# Patient Record
Sex: Female | Born: 1995 | Race: White | Hispanic: No | Marital: Single | Smoking: Never smoker
Health system: Southern US, Community
[De-identification: ages and names within clinical notes are randomized; demographics above are authoritative.]

## PROBLEM LIST (undated history)

## (undated) DIAGNOSIS — R51 Headache: Secondary | ICD-10-CM

## (undated) DIAGNOSIS — F329 Major depressive disorder, single episode, unspecified: Secondary | ICD-10-CM

## (undated) DIAGNOSIS — I10 Essential (primary) hypertension: Secondary | ICD-10-CM

## (undated) DIAGNOSIS — F909 Attention-deficit hyperactivity disorder, unspecified type: Secondary | ICD-10-CM

## (undated) DIAGNOSIS — F32A Depression, unspecified: Secondary | ICD-10-CM

## (undated) HISTORY — DX: Attention-deficit hyperactivity disorder, unspecified type: F90.9

## (undated) HISTORY — PX: TONSILECTOMY/ADENOIDECTOMY WITH MYRINGOTOMY: SHX6125

## (undated) HISTORY — DX: Headache: R51

## (undated) HISTORY — PX: OTHER SURGICAL HISTORY: SHX169

---
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
## 2014-01-09 ENCOUNTER — Ambulatory Visit (INDEPENDENT_AMBULATORY_CARE_PROVIDER_SITE_OTHER): Payer: BC Managed Care – PPO | Admitting: Psychiatry

## 2014-01-09 ENCOUNTER — Encounter (HOSPITAL_COMMUNITY): Payer: Self-pay | Admitting: Psychiatry

## 2014-01-09 VITALS — BP 170/84 | Ht 62.0 in | Wt 171.0 lb

## 2014-01-09 DIAGNOSIS — F323 Major depressive disorder, single episode, severe with psychotic features: Secondary | ICD-10-CM

## 2014-01-09 DIAGNOSIS — F332 Major depressive disorder, recurrent severe without psychotic features: Secondary | ICD-10-CM

## 2014-01-09 MED ORDER — HYDROXYZINE HCL 50 MG PO TABS: 50.0000 mg | ORAL_TABLET | Freq: Every day | ORAL | Status: DC

## 2014-01-09 MED ORDER — TOPIRAMATE 100 MG PO TABS: 150.0000 mg | ORAL_TABLET | Freq: Every day | ORAL | Status: DC

## 2014-01-09 MED ORDER — ESCITALOPRAM OXALATE 10 MG PO TABS: 10.0000 mg | ORAL_TABLET | Freq: Every day | ORAL | Status: DC

## 2014-01-09 MED ORDER — TRAZODONE HCL 50 MG PO TABS: ORAL_TABLET | ORAL | Status: DC
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
## 2014-02-20 ENCOUNTER — Encounter (HOSPITAL_COMMUNITY): Payer: Self-pay | Admitting: Psychiatry

## 2014-02-20 ENCOUNTER — Ambulatory Visit (INDEPENDENT_AMBULATORY_CARE_PROVIDER_SITE_OTHER): Payer: BC Managed Care – PPO | Admitting: Psychiatry

## 2014-02-20 VITALS — BP 130/70 | Ht 62.0 in | Wt 173.0 lb

## 2014-02-20 DIAGNOSIS — F332 Major depressive disorder, recurrent severe without psychotic features: Secondary | ICD-10-CM

## 2014-02-20 DIAGNOSIS — F323 Major depressive disorder, single episode, severe with psychotic features: Secondary | ICD-10-CM

## 2014-02-20 MED ORDER — HYDROXYZINE HCL 50 MG PO TABS: 50.0000 mg | ORAL_TABLET | Freq: Every day | ORAL | Status: DC

## 2014-02-20 MED ORDER — ESCITALOPRAM OXALATE 10 MG PO TABS: 10.0000 mg | ORAL_TABLET | Freq: Every day | ORAL | Status: DC

## 2014-02-20 MED ORDER — TOPIRAMATE 100 MG PO TABS: 150.0000 mg | ORAL_TABLET | Freq: Every day | ORAL | Status: DC
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
## 2014-02-23 ENCOUNTER — Other Ambulatory Visit: Payer: Self-pay | Admitting: Nurse Practitioner

## 2014-03-20 ENCOUNTER — Telehealth (HOSPITAL_COMMUNITY): Payer: Self-pay | Admitting: *Deleted

## 2014-04-04 ENCOUNTER — Telehealth (HOSPITAL_COMMUNITY): Payer: Self-pay | Admitting: *Deleted

## 2014-04-05 NOTE — Telephone Encounter (Signed)
Need release

## 2014-04-10 ENCOUNTER — Ambulatory Visit (INDEPENDENT_AMBULATORY_CARE_PROVIDER_SITE_OTHER): Payer: BC Managed Care – PPO | Admitting: Psychiatry

## 2014-04-10 ENCOUNTER — Encounter (HOSPITAL_COMMUNITY): Payer: Self-pay | Admitting: Psychiatry

## 2014-04-10 VITALS — BP 140/70 | Ht 62.0 in | Wt 180.0 lb

## 2014-04-10 DIAGNOSIS — F4310 Post-traumatic stress disorder, unspecified: Secondary | ICD-10-CM

## 2014-04-10 DIAGNOSIS — F332 Major depressive disorder, recurrent severe without psychotic features: Secondary | ICD-10-CM

## 2014-04-10 DIAGNOSIS — F323 Major depressive disorder, single episode, severe with psychotic features: Secondary | ICD-10-CM

## 2014-04-10 MED ORDER — HYDROXYZINE HCL 50 MG PO TABS: 50.0000 mg | ORAL_TABLET | Freq: Every day | ORAL | Status: DC

## 2014-04-10 MED ORDER — TOPIRAMATE 100 MG PO TABS: 150.0000 mg | ORAL_TABLET | Freq: Every day | ORAL | Status: DC

## 2014-04-10 MED ORDER — ESCITALOPRAM OXALATE 20 MG PO TABS: 20.0000 mg | ORAL_TABLET | Freq: Every day | ORAL | Status: DC
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
## 2014-05-05 ENCOUNTER — Encounter (HOSPITAL_COMMUNITY): Payer: Self-pay | Admitting: Psychiatry

## 2014-05-05 ENCOUNTER — Ambulatory Visit (INDEPENDENT_AMBULATORY_CARE_PROVIDER_SITE_OTHER): Payer: BC Managed Care – PPO | Admitting: Psychiatry

## 2014-05-05 VITALS — BP 138/80 | Ht 62.0 in | Wt 180.0 lb

## 2014-05-05 DIAGNOSIS — F323 Major depressive disorder, single episode, severe with psychotic features: Secondary | ICD-10-CM

## 2014-05-05 DIAGNOSIS — F332 Major depressive disorder, recurrent severe without psychotic features: Secondary | ICD-10-CM

## 2014-05-05 MED ORDER — ESCITALOPRAM OXALATE 20 MG PO TABS: 20.0000 mg | ORAL_TABLET | Freq: Every day | ORAL | Status: DC

## 2014-05-05 MED ORDER — TOPIRAMATE 100 MG PO TABS: 150.0000 mg | ORAL_TABLET | Freq: Every day | ORAL | Status: DC

## 2014-05-05 MED ORDER — HYDROXYZINE HCL 50 MG PO TABS: 50.0000 mg | ORAL_TABLET | Freq: Every day | ORAL | Status: DC
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
## 2014-05-11 ENCOUNTER — Ambulatory Visit (INDEPENDENT_AMBULATORY_CARE_PROVIDER_SITE_OTHER): Payer: BC Managed Care – PPO | Admitting: Psychiatry

## 2014-05-11 DIAGNOSIS — F329 Major depressive disorder, single episode, unspecified: Secondary | ICD-10-CM
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
## 2014-05-15 NOTE — Patient Instructions (Signed)
Discussed orally 

## 2014-05-23 ENCOUNTER — Ambulatory Visit (HOSPITAL_COMMUNITY): Payer: Self-pay | Admitting: Psychiatry

## 2014-05-23 ENCOUNTER — Telehealth (HOSPITAL_COMMUNITY): Payer: Self-pay | Admitting: *Deleted

## 2014-05-29 ENCOUNTER — Ambulatory Visit (HOSPITAL_COMMUNITY): Payer: Self-pay | Admitting: Psychiatry

## 2014-06-26 ENCOUNTER — Encounter: Payer: Self-pay | Admitting: Nurse Practitioner

## 2014-06-26 ENCOUNTER — Other Ambulatory Visit: Payer: Self-pay | Admitting: Nurse Practitioner

## 2014-06-26 ENCOUNTER — Ambulatory Visit (INDEPENDENT_AMBULATORY_CARE_PROVIDER_SITE_OTHER): Payer: BC Managed Care – PPO | Admitting: Nurse Practitioner

## 2014-06-26 VITALS — BP 138/70 | Ht 62.0 in | Wt 180.1 lb

## 2014-06-26 DIAGNOSIS — Z79899 Other long term (current) drug therapy: Secondary | ICD-10-CM

## 2014-06-26 DIAGNOSIS — I10 Essential (primary) hypertension: Secondary | ICD-10-CM

## 2014-06-26 MED ORDER — HYDROCHLOROTHIAZIDE 25 MG PO TABS: ORAL_TABLET | ORAL | Status: DC

## 2014-06-26 MED ORDER — VERAPAMIL HCL ER 120 MG PO TBCR
120.0000 mg | EXTENDED_RELEASE_TABLET | Freq: Every day | ORAL | Status: DC
Start: 2014-06-26 — End: 2015-02-24

## 2014-06-26 NOTE — Progress Notes (Signed)
Subjective:  Presents for routine follow up of HTN. No CP/ischemic type pain or SOB. Active. Weight stable. Did not get labs ordered previously. No edema.   Objective:   BP 138/70  Ht 5\' 2"  (1.575 m)  Wt 180 lb 2 oz (81.704 kg)  BMI 32.94 kg/m2 NAD. Alert, oriented. Lungs clear. Heart RRR. Lower extremities no edema.   Assessment:  Problem List Items Addressed This Visit     Cardiovascular and Mediastinum   Essential hypertension, benign - Primary   Relevant Medications      verapamil (CALAN-SR) CR tablet      hydrochlorothiazide tablet   Other Relevant Orders      Lipid panel      Hepatic function panel      Basic metabolic panel    Other Visit Diagnoses   Encounter for long-term (current) use of other medications        Relevant Orders       Hepatic function panel       Basic metabolic panel      Plan:  Meds ordered this encounter  Medications  . verapamil (CALAN-SR) 120 MG CR tablet    Sig: Take 1 tablet (120 mg total) by mouth daily. For hypertension.    Dispense:  30 tablet    Refill:  5    Order Specific Question:  Supervising Provider    Answer:  Darrol JumpJONNALAGADDA, JANARDHAHA R [[id number]]  . DISCONTD: hydrochlorothiazide (HYDRODIURIL) 25 MG tablet    Sig: Take one tablet each morning for your hypertension.    Dispense:  30 tablet    Refill:  5    Order Specific Question:  Supervising Provider    Answer:  Darrol JumpJONNALAGADDA, JANARDHAHA R [[id number]]  . hydrochlorothiazide (HYDRODIURIL) 25 MG tablet    Sig: Take one tablet each morning for your hypertension.    Dispense:  30 tablet    Refill:  5    Order Specific Question:  Supervising Provider    Answer:  Darrol JumpJONNALAGADDA, JANARDHAHA R [[id number]]   Strongly recommend PE. Encouraged exercise, healthy diet and weight loss. She receives Topamax from psych; reminded patient of risk of birth defects associated with med. Also, if dose goes above 200, recommend looking at other forms of birth control. Return in about 6 months (around  12/27/2014).

## 2014-07-05 ENCOUNTER — Ambulatory Visit (INDEPENDENT_AMBULATORY_CARE_PROVIDER_SITE_OTHER): Payer: BC Managed Care – PPO | Admitting: Psychiatry

## 2014-07-05 ENCOUNTER — Encounter (HOSPITAL_COMMUNITY): Payer: Self-pay | Admitting: Psychiatry

## 2014-07-05 VITALS — BP 120/78 | Ht 62.0 in | Wt 180.0 lb

## 2014-07-05 DIAGNOSIS — F332 Major depressive disorder, recurrent severe without psychotic features: Secondary | ICD-10-CM

## 2014-07-05 DIAGNOSIS — F321 Major depressive disorder, single episode, moderate: Secondary | ICD-10-CM

## 2014-07-05 MED ORDER — ESCITALOPRAM OXALATE 20 MG PO TABS
20.0000 mg | ORAL_TABLET | Freq: Every day | ORAL | Status: DC
Start: 2014-07-05 — End: 2014-10-05

## 2014-07-05 MED ORDER — HYDROXYZINE HCL 50 MG PO TABS: 50.0000 mg | ORAL_TABLET | Freq: Every day | ORAL | Status: DC

## 2014-07-05 MED ORDER — TOPIRAMATE 100 MG PO TABS: 150.0000 mg | ORAL_TABLET | Freq: Every day | ORAL | Status: DC
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
## 2014-07-10 ENCOUNTER — Other Ambulatory Visit: Payer: Self-pay | Admitting: Nurse Practitioner

## 2014-07-13 ENCOUNTER — Ambulatory Visit (INDEPENDENT_AMBULATORY_CARE_PROVIDER_SITE_OTHER): Payer: BC Managed Care – PPO | Admitting: Psychiatry

## 2014-07-13 DIAGNOSIS — F321 Major depressive disorder, single episode, moderate: Secondary | ICD-10-CM

## 2014-07-13 NOTE — Patient Instructions (Signed)
Discussed orally 

## 2014-07-13 NOTE — Progress Notes (Signed)
   THERAPIST PROGRESS NOTE  Session Time: Thursday 07/13/2014 2:10 PM - 3:00 PM  Participation Level: Active  Behavioral Response: CasualAlertAnxious  Type of Therapy: Individual Therapy  Treatment Goals addressed:  Establish rapport, improve ability to manage stress and anxiety  Interventions: Supportive  Summary: Amy Burke is a 18 y.o. female who is referred for services by psychiatrist Dr. Tenny Crawoss to improve coping skills. She is a returning patient to this clinician as she was seen once in 2013 due to suffering from anxiety and depression but did not return to treatment due to financial reasons. Patient continued to struggle with symptoms and was hospitalized [facility] due to self-injurious behaviors and suicidal thoughts. Patient reports she took 6 of her mother's pain pills prior to being taken to the ER. She began seeing a psychiatrist Dr. Tenny Crawoss upon discharge for medication management. Patient had been doing fairly well until a month ago when she broke up with her girlfriend. Patient reports this is the third time she and her girlfriend have ended their relationship. Her girlfriend refuses to have any contact with patient who feels abandoned and betrayed as girlfriend was cheating. She also reports stress related to her father as he stated he did not want to get to know her after learning she is pansexual. She reports the relationship is better than it was since father gave up drinking but expresses sadness they do not have a close relationship. Patient also reports stress related to no being where she thinks she should be in life like having a job and a Information systems managerdriver's license.  Patient reports feeling better since last session. She has increased involvement in activity going out with friends, exercising, and looking for a job. She plans to enroll in community college in January and pursue cosmetology degree.  In the interm, she plans to obtain a driver's  license. She reports positive interaction and relationship with both of her parents. Patient recently began dating someone but expresses frustration as their relationship is not clearly defined. Patient states beginning to have strong feelings about the down she is dating but says he does not really respond when she expresses her feelings. She also reports he seems to send her mixed messages. Patient reports part of her trust issues originate from her previous relationships.   Suicidal/Homicidal: No  Therapist Response: Therapist works with patient to process feelings, reinforcing patient's involvement in activity, discuss assertiveness skills and ways to set and maintain boundaries, identify patient's expectations in a relationship, identify ways patient can pursue personal development  Plan: Return again in 3 weeks.  Diagnosis: Axis I: MDD    Axis II: Deferred    Amy Horiuchi, LCSW 07/13/2014

## 2014-08-01 ENCOUNTER — Ambulatory Visit (INDEPENDENT_AMBULATORY_CARE_PROVIDER_SITE_OTHER): Payer: BC Managed Care – PPO | Admitting: Psychiatry

## 2014-08-01 DIAGNOSIS — F321 Major depressive disorder, single episode, moderate: Secondary | ICD-10-CM

## 2014-08-01 NOTE — Patient Instructions (Signed)
Discussed orally 

## 2014-08-01 NOTE — Progress Notes (Signed)
   THERAPIST PROGRESS NOTE  Session Time: Tuesday 08/01/2014 2:05 -2:55 PM  Participation Level: Active  Behavioral Response: CasualAlertAnxious and Depressed/tearful  Type of Therapy: Individual Therapy  Treatment Goals addressed:  Improve ability to manage stress and anxiety      Process and resolve negative feelings associated with the past and her relationship with her parents      Increase self acceptance      Improve emotional regulation skills      Improve interpersonal skills  Interventions: CBT and Supportive  Summary: Amy Burke is a 18 y.o. female who who is referred for services by psychiatrist Dr. Tenny Crawoss to improve coping skills. She is a returning patient to this clinician as she was seen once in 2013 due to suffering from anxiety and depression but did not return to treatment due to financial reasons. Patient continued to struggle with symptoms and was hospitalized [facility] due to self-injurious behaviors and suicidal thoughts.   Since last session, patient reports increased depressed mood and isolative behaviors due to break-up with boyfriend. She blames self for break-up as she reports pushing boyfriend to date each other exclusively. This has triggered abandonment and trust issues patient has experienced since childhood. Patient shares more information today about these issues.  Suicidal/Homicidal: No  Therapist Response: Therapist works with patient to process feelings, develop treatment plan.  Plan: Return again in 3 weeks. Patient agrees to complete self-monitoring feelings form.  Diagnosis: Axis I: MDD, Single Episode, Moderate    Axis II: Deferred    Palmer Shorey, LCSW 08/01/2014

## 2014-08-09 ENCOUNTER — Ambulatory Visit (INDEPENDENT_AMBULATORY_CARE_PROVIDER_SITE_OTHER): Payer: BC Managed Care – PPO | Admitting: Nurse Practitioner

## 2014-08-09 ENCOUNTER — Encounter: Payer: Self-pay | Admitting: Nurse Practitioner

## 2014-08-09 VITALS — BP 128/76 | Ht 62.0 in | Wt 180.0 lb

## 2014-08-09 DIAGNOSIS — N760 Acute vaginitis: Secondary | ICD-10-CM

## 2014-08-09 DIAGNOSIS — Z113 Encounter for screening for infections with a predominantly sexual mode of transmission: Secondary | ICD-10-CM

## 2014-08-10 LAB — GC/CHLAMYDIA PROBE AMP, URINE
CHLAMYDIA, SWAB/URINE, PCR: NEGATIVE
GC PROBE AMP, URINE: NEGATIVE

## 2014-08-15 ENCOUNTER — Encounter: Payer: Self-pay | Admitting: Nurse Practitioner

## 2014-08-15 NOTE — Progress Notes (Signed)
Subjective:  Presents complaints of vaginal discharge for the past 2 weeks. Noticed after intercourse. White discharge with slight yellowish color at times. No fever. No pelvic pain. No missed birth control pills. Has only had one partner. No urinary symptoms. No signs of retained tampon. Is on her menstrual cycle today.  Objective:   BP 128/76  Ht  (1.575 m)  Wt 180 lb (81.647 kg)  BMI 32.91 kg/m2  LMP 08/09/2014 NAD. Alert, oriented. Lungs clear. Heart regular rate rhythm. Abdomen soft nondistended nontender. Unable to get wet prep due to menses.  Assessment: Vaginitis  Screen for STD (sexually transmitted disease) - Plan: GC/chlamydia probe amp, urine  Plan: Routine screening for gonorrhea and Chlamydia. Patient to come back after her cycle if discharge persists so a sample can be obtained. Warning signs reviewed. Reviewed safe sex issues.

## 2014-08-23 ENCOUNTER — Ambulatory Visit (INDEPENDENT_AMBULATORY_CARE_PROVIDER_SITE_OTHER): Payer: BC Managed Care – PPO | Admitting: Psychiatry

## 2014-08-23 DIAGNOSIS — F321 Major depressive disorder, single episode, moderate: Secondary | ICD-10-CM

## 2014-08-23 NOTE — Progress Notes (Signed)
   THERAPIST PROGRESS NOTE  Session Time: Wednesday 08/23/2014 11:10 AM -12:10 PM  Participation Level: Active  Behavioral Response: CasualAlertDepressed  Type of Therapy: Individual Therapy  Treatment Goals addressed: Improve ability to manage stress and anxiety   Interventions: Supportive psychoeducation  Summary: Amy Burke is a 18 y.o. female who is referred for services by psychiatrist Dr. Tenny Craw to improve coping skills. She is a returning patient to this clinician as she was seen once in 2013 due to suffering from anxiety and depression but did not return to treatment due to financial reasons. Patient continued to struggle with symptoms and was hospitalized at the [facility] in November 2014 due to self-injurious behaviors and suicidal thoughts.   Patient reports decreased crying spells but poor motivation, low energy, depressed mood, emotional eating, and excessive sleeping since last session. She states feeling as though the medication may need to be changed She continues to express hurt about breakup with boyfriend. She also expresses frustration, resentment,  and anger with 16 year old sister as patient's parents are providing financial support to her and her 68-year-old daughter and have told patient this is the reason they can't help her with getting her driver's license and insurance. Patient states being put on the "back burner" in the past.    Suicidal/Homicidal: No  Therapist Response: Therapist works with patient to identify and verbalize feelings, identify areas within patient's control, identify ways to improve communication with parents, identify ways to improve self-care and increase activity, provides psychoeducation regarding depression, discuss referral to psychiatrist Dr. Tenny Craw for an earlier appointment for medication management  Plan: Return again in 2 weeks. Patient agrees to schedule an earlier appointment with psychiatrist Dr. Tenny Craw for medication  management. The patient agrees to use plan your day handouts and bring to next session  Diagnosis: Axis I: MDD, single episode, moderate    Axis II: Deferred    Amy Avedisian, LCSW 08/23/2014

## 2014-08-23 NOTE — Patient Instructions (Signed)
Discussed orally 

## 2014-09-07 ENCOUNTER — Telehealth (HOSPITAL_COMMUNITY): Payer: Self-pay | Admitting: *Deleted

## 2014-09-12 ENCOUNTER — Ambulatory Visit (HOSPITAL_COMMUNITY): Payer: Self-pay | Admitting: Psychiatry

## 2014-09-25 ENCOUNTER — Ambulatory Visit (HOSPITAL_COMMUNITY): Payer: Self-pay | Admitting: Psychiatry

## 2014-10-03 ENCOUNTER — Ambulatory Visit (HOSPITAL_COMMUNITY): Payer: Self-pay | Admitting: Psychiatry

## 2014-10-05 ENCOUNTER — Ambulatory Visit (INDEPENDENT_AMBULATORY_CARE_PROVIDER_SITE_OTHER): Payer: BC Managed Care – PPO | Admitting: Psychiatry

## 2014-10-05 ENCOUNTER — Encounter (HOSPITAL_COMMUNITY): Payer: Self-pay | Admitting: Psychiatry

## 2014-10-05 VITALS — BP 136/96 | HR 92 | Ht 62.0 in | Wt 187.0 lb

## 2014-10-05 DIAGNOSIS — F321 Major depressive disorder, single episode, moderate: Secondary | ICD-10-CM

## 2014-10-05 DIAGNOSIS — F332 Major depressive disorder, recurrent severe without psychotic features: Secondary | ICD-10-CM

## 2014-10-05 MED ORDER — TOPIRAMATE 100 MG PO TABS: 150.0000 mg | ORAL_TABLET | Freq: Every day | ORAL | Status: DC

## 2014-10-05 MED ORDER — HYDROXYZINE HCL 50 MG PO TABS: 50.0000 mg | ORAL_TABLET | Freq: Every day | ORAL | Status: AC

## 2014-10-05 MED ORDER — ESCITALOPRAM OXALATE 20 MG PO TABS: 20.0000 mg | ORAL_TABLET | Freq: Every day | ORAL | Status: DC
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
## 2014-11-03 ENCOUNTER — Other Ambulatory Visit: Payer: Self-pay | Admitting: Nurse Practitioner

## 2014-11-29 ENCOUNTER — Emergency Department (HOSPITAL_COMMUNITY)
Admission: EM | Admit: 2014-11-29 | Discharge: 2014-11-29 | Disposition: A | Discharge status: Home / Self Care | Payer: BC Managed Care – PPO | Attending: Emergency Medicine | Admitting: Emergency Medicine

## 2014-11-29 ENCOUNTER — Emergency Department (HOSPITAL_COMMUNITY): Payer: BC Managed Care – PPO

## 2014-11-29 ENCOUNTER — Encounter (HOSPITAL_COMMUNITY): Payer: Self-pay | Admitting: *Deleted

## 2014-11-29 DIAGNOSIS — S80212A Abrasion, left knee, initial encounter: Secondary | ICD-10-CM | POA: Insufficient documentation

## 2014-11-29 DIAGNOSIS — W010XXA Fall on same level from slipping, tripping and stumbling without subsequent striking against object, initial encounter: Secondary | ICD-10-CM | POA: Diagnosis not present

## 2014-11-29 DIAGNOSIS — F329 Major depressive disorder, single episode, unspecified: Secondary | ICD-10-CM | POA: Diagnosis not present

## 2014-11-29 DIAGNOSIS — T07XXXA Unspecified multiple injuries, initial encounter: Secondary | ICD-10-CM

## 2014-11-29 DIAGNOSIS — S99911A Unspecified injury of right ankle, initial encounter: Secondary | ICD-10-CM | POA: Diagnosis present

## 2014-11-29 DIAGNOSIS — Y998 Other external cause status: Secondary | ICD-10-CM | POA: Insufficient documentation

## 2014-11-29 DIAGNOSIS — Y9289 Other specified places as the place of occurrence of the external cause: Secondary | ICD-10-CM | POA: Insufficient documentation

## 2014-11-29 DIAGNOSIS — S93401A Sprain of unspecified ligament of right ankle, initial encounter: Secondary | ICD-10-CM | POA: Diagnosis not present

## 2014-11-29 DIAGNOSIS — S80211A Abrasion, right knee, initial encounter: Secondary | ICD-10-CM | POA: Insufficient documentation

## 2014-11-29 DIAGNOSIS — Z79899 Other long term (current) drug therapy: Secondary | ICD-10-CM | POA: Diagnosis not present

## 2014-11-29 DIAGNOSIS — I10 Essential (primary) hypertension: Secondary | ICD-10-CM | POA: Insufficient documentation

## 2014-11-29 DIAGNOSIS — G43909 Migraine, unspecified, not intractable, without status migrainosus: Secondary | ICD-10-CM | POA: Diagnosis not present

## 2014-11-29 DIAGNOSIS — Y9389 Activity, other specified: Secondary | ICD-10-CM | POA: Insufficient documentation

## 2014-11-29 DIAGNOSIS — T1490XA Injury, unspecified, initial encounter: Secondary | ICD-10-CM

## 2014-11-29 MED ORDER — BACITRACIN 500 UNIT/GM EX OINT
1.0000 "application " | TOPICAL_OINTMENT | Freq: Two times a day (BID) | CUTANEOUS | Status: DC
  Administered 2014-11-29: 1 via TOPICAL
  Filled 2014-11-29 (×4): qty 0.9

## 2014-11-29 MED ORDER — BACITRACIN ZINC 500 UNIT/GM EX OINT
TOPICAL_OINTMENT | CUTANEOUS | Status: AC
  Administered 2014-11-29: 1 via TOPICAL
  Filled 2014-11-29: qty 0.9

## 2014-11-29 MED ORDER — IBUPROFEN 800 MG PO TABS: 800.0000 mg | ORAL_TABLET | Freq: Three times a day (TID) | ORAL | Status: AC

## 2014-11-29 MED ORDER — IBUPROFEN 800 MG PO TABS
800.0000 mg | ORAL_TABLET | Freq: Once | ORAL | Status: AC
  Administered 2014-11-29: 800 mg via ORAL
  Filled 2014-11-29: qty 1

## 2014-11-29 NOTE — Discharge Instructions (Signed)

## 2014-11-29 NOTE — ED Provider Notes (Signed)
CSN: [id number]     Arrival date & time 11/29/14  1612 History   First MD Initiated Contact with Patient 11/29/14 1705     Chief Complaint  Patient presents with  . Ankle Pain     (Consider location/radiation/quality/duration/timing/severity/associated sxs/prior Treatment) HPI  Amy Burke is a 18 y.o. female who presents to the Emergency Department complaining of right ankle pain.  She states the she tripped and fell, landing on her knees and twisting her ankle.  Reports pain to outside of her ankle that is worse with movement and weight bearing.  She states that her knees are "okay just scraped".  She denies swelling, numbness or weakness, or difficulty bending her knees.  She has not tried any therapies prior to ED arrival.    Past Medical History  Diagnosis Date  . Headache(784.0)     Migraines  . ADHD (attention deficit hyperactivity disorder)   . Depression   . Hypertension    Past Surgical History  Procedure Laterality Date  . Tubes in ears    . Tonsilectomy/adenoidectomy with myringotomy     Family History  Problem Relation Age of Onset  . Anxiety disorder Mother   . Depression Mother   . ADD / ADHD Sister   . Bipolar disorder Sister   . Depression Maternal Uncle   . Alcohol abuse Father    History  Substance Use Topics  . Smoking status: Never Smoker   . Smokeless tobacco: Never Used  . Alcohol Use: No   OB History    No data available     Review of Systems  Constitutional: Negative for fever and chills.  Genitourinary: Negative for dysuria and difficulty urinating.  Musculoskeletal: Positive for joint swelling and arthralgias (right ankle pain).  Skin: Negative for color change and wound.       Abrasions both knees   Neurological: Negative for dizziness and syncope.  All other systems reviewed and are negative.     Allergies  Mustard  Home Medications   Prior to Admission medications   Medication Sig Start Date End Date Taking?  Authorizing Provider  escitalopram (LEXAPRO) 20 MG tablet Take 1 tablet (20 mg total) by mouth daily. 10/05/14 10/05/15  Diannia Rudereborah Ross, MD  hydrochlorothiazide (HYDRODIURIL) 25 MG tablet Take one tablet each morning for your hypertension. 06/26/14   Campbell Richesarolyn C Hoskins, NP  hydrOXYzine (ATARAX/VISTARIL) 50 MG tablet Take 1 tablet (50 mg total) by mouth at bedtime. For insomnia. 10/05/14   Diannia Rudereborah Ross, MD  PORTIA-28 0.15-30 MG-MCG tablet TAKE 1 TABLET ONCE DAILY AS DIRECTED. 11/04/14   Merlyn AlbertWilliam S Luking, MD  topiramate (TOPAMAX) 100 MG tablet Take 1.5 tablets (150 mg total) by mouth daily. Take 1 and 1/2 tablets by mouth daily for mood stabilization. 10/05/14   Diannia Rudereborah Ross, MD  verapamil (CALAN-SR) 120 MG CR tablet Take 1 tablet (120 mg total) by mouth daily. For hypertension. 06/26/14   Campbell Richesarolyn C Hoskins, NP   BP 133/80 mmHg  Pulse 88  Temp(Src) 98.5 F (36.9 C) (Oral)  Resp 18  Ht 5\' 2"  (1.575 m)  Wt 180 lb (81.647 kg)  BMI 32.91 kg/m2  SpO2 99%  LMP 11/15/2014 Physical Exam  Constitutional: She is oriented to person, place, and time. She appears well-developed and well-nourished. No distress.  HENT:  Head: Normocephalic and atraumatic.  Neck: Normal range of motion. Neck supple.  Cardiovascular: Normal rate, regular rhythm, normal heart sounds and intact distal pulses.   No murmur heard. Pulmonary/Chest: Effort normal  and breath sounds normal.  Musculoskeletal: She exhibits tenderness.  ttp of the lateral right ankle, mild STS present.  No obvious ligament laxity.  DP pulse is brisk,distal sensation intact.  No erythema, abrasion, bruising or bony deformity.  No proximal tenderness. Pt has fullR OM of the bilateral knees, no edema  Neurological: She is alert and oriented to person, place, and time. She exhibits normal muscle tone. Coordination normal.  Skin: Skin is warm and dry.  Small abrasions to bilateral knees.   Nursing note and vitals reviewed.   ED Course  Procedures  (including critical care time) Labs Review Labs Reviewed - No data to display  Imaging Review Dg Ankle Complete Right  11/29/2014   CLINICAL DATA:  Lateral ankle pain and swelling post fall 1 hr ago, tripped on walkway and fell on knees  EXAM: RIGHT ANKLE - COMPLETE 3+ VIEW  COMPARISON:  None  FINDINGS: Three views of the right ankle submitted. No acute fracture or subluxation. Ankle mortise is preserved. Significant soft tissue swelling adjacent to lateral malleolus.  IMPRESSION: No acute fracture or subluxation.  Lateral soft tissue swelling.   Electronically Signed   By: Natasha MeadLiviu  Pop M.D.   On: 11/29/2014 16:46     EKG Interpretation None      MDM   Final diagnoses:  Multiple abrasions  Sprain of ankle, right, initial encounter    abrasions to the knees were cleaned and dressed with bacitracin. ASO applied.  Pain improved.  Td is UTD per patient. Pt has own crutches.  Agrees to elevate, ice and ortho f/u in one week if needed.  Rx for 800 mg ibuprofen    Prudencio Velazco L. Trisha Mangleriplett, PA-C 12/01/14 1318  Flint MelterElliott L Wentz, MD 12/02/14 [phone]

## 2014-11-29 NOTE — ED Notes (Signed)
Patient given discharge instruction, verbalized understand. Patient ambulatory out of the department.  

## 2014-11-29 NOTE — ED Notes (Signed)
Pt states pain to right ankle and abrasions to both knees after falling while getting her niece off the bus just PTA.

## 2015-01-05 ENCOUNTER — Ambulatory Visit (HOSPITAL_COMMUNITY): Payer: Self-pay | Admitting: Psychiatry

## 2015-01-22 ENCOUNTER — Encounter: Payer: Self-pay | Admitting: Nurse Practitioner

## 2015-01-22 ENCOUNTER — Ambulatory Visit (INDEPENDENT_AMBULATORY_CARE_PROVIDER_SITE_OTHER): Payer: BC Managed Care – PPO | Admitting: Nurse Practitioner

## 2015-01-22 VITALS — BP 120/70 | Ht 62.0 in | Wt 191.2 lb

## 2015-01-22 DIAGNOSIS — N760 Acute vaginitis: Secondary | ICD-10-CM

## 2015-01-22 DIAGNOSIS — Z0000 Encounter for general adult medical examination without abnormal findings: Secondary | ICD-10-CM

## 2015-01-22 DIAGNOSIS — H65193 Other acute nonsuppurative otitis media, bilateral: Secondary | ICD-10-CM

## 2015-01-22 DIAGNOSIS — Z01419 Encounter for gynecological examination (general) (routine) without abnormal findings: Secondary | ICD-10-CM

## 2015-01-22 LAB — POCT WET PREP WITH KOH
Clue Cells Wet Prep HPF POC: POSITIVE
Epithelial Wet Prep HPF POC: POSITIVE
KOH PREP POC: NEGATIVE
Trichomonas, UA: NEGATIVE
pH, Wet Prep: 4.5

## 2015-01-22 MED ORDER — AZITHROMYCIN 250 MG PO TABS: ORAL_TABLET | ORAL | Status: DC

## 2015-01-22 MED ORDER — FLUCONAZOLE 150 MG PO TABS: ORAL_TABLET | ORAL | Status: DC

## 2015-01-22 NOTE — Progress Notes (Signed)
Subjective:    Patient ID: Amy Burke, female    DOB: 1996/02/28, 19 y.o.   MRN: [id number]  HPI presents for her wellness exam. Her mother is present for part of visit. C/o vaginal discharge since August. White with odor. No pelvic pain. Mo missed oc's. Regular menses, normal flow. No new sexual partners since August. Needs dental exam. Working out on a regular basis. Continues to see psychiatrist.    Review of Systems  Constitutional: Negative for fever, activity change, appetite change and fatigue.  HENT: Positive for congestion and rhinorrhea. Negative for dental problem, ear pain, sinus pressure and sore throat.   Respiratory: Negative for cough, chest tightness, shortness of breath and wheezing.   Cardiovascular: Negative for chest pain.  Gastrointestinal: Negative for nausea, vomiting, abdominal pain, diarrhea, constipation and abdominal distention.  Genitourinary: Negative for dysuria, urgency, frequency, vaginal discharge, enuresis, difficulty urinating, genital sores, menstrual problem and pelvic pain.       Objective:   Physical Exam  Constitutional: She is oriented to person, place, and time. She appears well-developed. No distress.  HENT:  Right Ear: External ear normal.  Left Ear: External ear normal.  TMs dull with moderate erythema bilat; yellowish effusion on left. Pharynx moderate erythema.  Neck: Normal range of motion. Neck supple. No tracheal deviation present. No thyromegaly present.  Cardiovascular: Normal rate, regular rhythm and normal heart sounds.  Exam reveals no gallop.   No murmur heard. Pulmonary/Chest: Effort normal and breath sounds normal.  Abdominal: Soft. She exhibits no distension. There is no tenderness.  Genitourinary: Uterus normal. Vaginal discharge found.  External GU: no rashes or lesions. Vagina: moderate amount yellowish mucoid discharge. No CMT. Bimanual exam: no tenderness or obvious masses; exam limited due to abd girth.     Musculoskeletal: She exhibits no edema.  Lymphadenopathy:    She has cervical adenopathy.  Neurological: She is alert and oriented to person, place, and time.  Skin: Skin is warm and dry. No rash noted.  Psychiatric: She has a normal mood and affect. Her behavior is normal. Thought content normal.  Vitals reviewed. Breast exam: no masses; axillae no adenopathy.  Results for orders placed or performed in visit on 01/22/15  POCT Wet Prep with KOH  Result Value Ref Range   Trichomonas, UA Negative    Clue Cells Wet Prep HPF POC pos    Epithelial Wet Prep HPF POC pos    Yeast Wet Prep HPF POC occas    Bacteria Wet Prep HPF POC rare    RBC Wet Prep HPF POC rare    WBC Wet Prep HPF POC occas    KOH Prep POC Negative    pH, Wet Prep 4.5            Assessment & Plan:  Well woman exam  Vaginitis and vulvovaginitis - Plan: GC/chlamydia probe amp, urine  Acute nonsuppurative otitis media of both ears  Meds ordered this encounter  Medications  . azithromycin (ZITHROMAX Z-PAK) 250 MG tablet    Sig: Take 2 tablets (500 mg) on  Day 1,  followed by 1 tablet (250 mg) once daily on Days 2 through 5.    Dispense:  6 each    Refill:  0    Order Specific Question:  Supervising Provider    Answer:  Merlyn Albert [[id number]]  . fluconazole (DIFLUCAN) 150 MG tablet    Sig: One po qd prn yeast infection; may repeat in 3-4 days if needed  Dispense:  2 tablet    Refill:  0    Order Specific Question:  Supervising Provider    Answer:  Merlyn AlbertLUKING, WILLIAM S [[id number]]   Encouraged continued weight loss and exercise. Discussed safe sex issues. Call back in 7-10 days if discharge persists. Will prescribe Metronidazole.  May need to reduce or stop BP med if continued weight loss. To monitor BP outside of office and call back if any problems. Patient was sure she had a Dt at recent ED visit but was not given according to notes. A review of her paper chart also shows no evidence of vaccine. Will address at  later visit.  Return in about 6 months (around 07/23/2015). Next PE in one year.

## 2015-01-23 LAB — GC/CHLAMYDIA PROBE AMP, URINE
Chlamydia, Swab/Urine, PCR: NEGATIVE
GC Probe Amp, Urine: NEGATIVE

## 2015-01-24 ENCOUNTER — Encounter: Payer: Self-pay | Admitting: *Deleted

## 2015-01-25 ENCOUNTER — Ambulatory Visit (HOSPITAL_COMMUNITY): Payer: Self-pay | Admitting: Psychiatry

## 2015-01-30 ENCOUNTER — Other Ambulatory Visit: Payer: Self-pay | Admitting: *Deleted

## 2015-01-30 ENCOUNTER — Telehealth: Payer: Self-pay | Admitting: Nurse Practitioner

## 2015-01-30 MED ORDER — METRONIDAZOLE 500 MG PO TABS: 500.0000 mg | ORAL_TABLET | Freq: Two times a day (BID) | ORAL | Status: DC

## 2015-01-30 NOTE — Telephone Encounter (Signed)
Pt is done with the diflucan, still having itch, irritation, heavy discharge  [facility]

## 2015-01-30 NOTE — Telephone Encounter (Signed)
Discussed with pt. Med sent to pharm.  

## 2015-01-30 NOTE — Telephone Encounter (Signed)
Flagyl 500, 1 bid 7 days

## 2015-01-30 NOTE — Telephone Encounter (Signed)
Pt was seen here on 02/08 for wellness exam with Eber Jonesarolyn. DX with vaginitis and vilvovaginitis. Was prescribed zpak and diflucan.  Was told to call back in 7-10 days if discharge persists. Will prescribe Metronidazole.   Need to know dose of metronidazole.

## 2015-02-22 NOTE — Progress Notes (Signed)
Card sent 

## 2015-02-24 ENCOUNTER — Other Ambulatory Visit: Payer: Self-pay | Admitting: Nurse Practitioner

## 2015-05-04 ENCOUNTER — Other Ambulatory Visit (HOSPITAL_COMMUNITY): Payer: Self-pay | Admitting: Psychiatry

## 2015-05-04 ENCOUNTER — Other Ambulatory Visit: Payer: Self-pay | Admitting: Nurse Practitioner

## 2015-05-22 ENCOUNTER — Other Ambulatory Visit: Payer: Self-pay | Admitting: Family Medicine

## 2015-05-28 ENCOUNTER — Telehealth: Payer: Self-pay | Admitting: Family Medicine

## 2015-05-28 NOTE — Telephone Encounter (Signed)
Notified Christine (mom) that this medication is prescribed by Dr. Tenny Craw and they will need to contact Dr. Charlott Rakes office for refills on this medication. Mom verbalized understanding.

## 2015-05-28 NOTE — Telephone Encounter (Signed)
Pt is needing a refill on her escitalopram 20 mg. I informed pt's mother that due to a different Dr prescribing it last that more than likely it will have to be refilled through that Dr.  Robbie Lis

## 2015-06-08 ENCOUNTER — Ambulatory Visit (INDEPENDENT_AMBULATORY_CARE_PROVIDER_SITE_OTHER): Payer: BC Managed Care – PPO | Admitting: Nurse Practitioner

## 2015-06-08 VITALS — BP 132/80 | Ht 62.0 in | Wt 203.0 lb

## 2015-06-08 DIAGNOSIS — I10 Essential (primary) hypertension: Secondary | ICD-10-CM

## 2015-06-08 MED ORDER — LEVONORGESTREL-ETHINYL ESTRAD 0.15-30 MG-MCG PO TABS: ORAL_TABLET | ORAL | Status: DC

## 2015-06-08 MED ORDER — HYDROCHLOROTHIAZIDE 25 MG PO TABS: ORAL_TABLET | ORAL | Status: DC

## 2015-06-08 MED ORDER — VERAPAMIL HCL ER 120 MG PO TBCR: EXTENDED_RELEASE_TABLET | ORAL | Status: DC

## 2015-06-08 NOTE — Patient Instructions (Addendum)
Phentermine for weight loss Couch potato to 5k [email or web address]

## 2015-06-11 ENCOUNTER — Other Ambulatory Visit (HOSPITAL_COMMUNITY): Payer: Self-pay | Admitting: Psychiatry

## 2015-06-11 ENCOUNTER — Encounter: Payer: Self-pay | Admitting: Nurse Practitioner

## 2015-06-11 NOTE — Progress Notes (Signed)
Subjective:  Presents for routine follow-up on blood pressure. Working out on a regular basis. According to patient she had 4 pounds weight loss over the past week. Cycles are regular with normal flow. Denies any missed birth control pills. Needs refill today. No intercourse 4 months, no new partners since her last gonorrhea and Chlamydia test in February. No chest pain/shortness of breath or edema.  Objective:   BP 132/80 mmHg  Ht 5\' 2"  (1.575 m)  Wt 203 lb (92.08 kg)  BMI 37.12 kg/m2 NAD. Alert, oriented. Lungs clear. Heart regular rate rhythm. Lower extremities no edema.  Assessment:  Problem List Items Addressed This Visit      Cardiovascular and Mediastinum   Essential hypertension, benign - Primary   Relevant Medications   verapamil (CALAN-SR) 120 MG CR tablet   hydrochlorothiazide (HYDRODIURIL) 25 MG tablet     Plan:  Meds ordered this encounter  Medications  . levonorgestrel-ethinyl estradiol (PORTIA-28) 0.15-30 MG-MCG tablet    Sig: TAKE 1 TABLET ONCE DAILY AS DIRECTED.    Dispense:  28 tablet    Refill:  11    Order Specific Question:  Supervising Provider    Answer:  Merlyn AlbertLUKING, WILLIAM S [[id number]]  . verapamil (CALAN-SR) 120 MG CR tablet    Sig: TAKE 1 TABLET EACH MORNING FOR BLOOD PRESSURE.    Dispense:  30 tablet    Refill:  5    Order Specific Question:  Supervising Provider    Answer:  Merlyn AlbertLUKING, WILLIAM S [[id number]]  . hydrochlorothiazide (HYDRODIURIL) 25 MG tablet    Sig: TAKE ONE TABLET EVERY MORNING FOR HIGH BLOOD PRESSURE.    Dispense:  30 tablet    Refill:  5    Order Specific Question:  Supervising Provider    Answer:  Merlyn AlbertLUKING, WILLIAM S [[id number]]   Continue current blood pressure medication. Encouraged weight loss and continued exercise. Discussed safe sex issues.  Return in about 6 months (around 12/08/2015) for recheck. Continue follow up with mental health.

## 2015-06-27 ENCOUNTER — Ambulatory Visit (INDEPENDENT_AMBULATORY_CARE_PROVIDER_SITE_OTHER): Payer: BC Managed Care – PPO | Admitting: Psychiatry

## 2015-06-27 ENCOUNTER — Encounter (HOSPITAL_COMMUNITY): Payer: Self-pay | Admitting: Psychiatry

## 2015-06-27 VITALS — BP 155/98 | HR 86 | Ht 60.0 in | Wt 203.0 lb

## 2015-06-27 DIAGNOSIS — F332 Major depressive disorder, recurrent severe without psychotic features: Secondary | ICD-10-CM | POA: Diagnosis not present

## 2015-06-27 DIAGNOSIS — F321 Major depressive disorder, single episode, moderate: Secondary | ICD-10-CM

## 2015-06-27 MED ORDER — TOPIRAMATE 100 MG PO TABS: 150.0000 mg | ORAL_TABLET | Freq: Every day | ORAL | Status: AC

## 2015-06-27 MED ORDER — LISDEXAMFETAMINE DIMESYLATE 40 MG PO CAPS: 40.0000 mg | ORAL_CAPSULE | ORAL | Status: AC

## 2015-06-27 MED ORDER — ESCITALOPRAM OXALATE 20 MG PO TABS: 20.0000 mg | ORAL_TABLET | Freq: Every day | ORAL | Status: AC
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
## 2015-06-28 ENCOUNTER — Telehealth: Payer: Self-pay | Admitting: Nurse Practitioner

## 2015-06-28 ENCOUNTER — Other Ambulatory Visit: Payer: Self-pay | Admitting: Nurse Practitioner

## 2015-06-28 NOTE — Telephone Encounter (Signed)
Dr. Tenny Crawoss recommends TSH and dietary consultation for this patient. I see order for TSH in chart but not sure why it is in there or if patient knows about it. Please put in order. I will go ahead and send one for dietician. Thanks.

## 2015-07-11 ENCOUNTER — Ambulatory Visit (HOSPITAL_COMMUNITY): Payer: Self-pay | Admitting: Psychiatry

## 2015-07-20 ENCOUNTER — Other Ambulatory Visit: Payer: Self-pay | Admitting: Nurse Practitioner

## 2015-07-26 ENCOUNTER — Telehealth (HOSPITAL_COMMUNITY): Payer: Self-pay | Admitting: *Deleted

## 2015-07-26 ENCOUNTER — Ambulatory Visit (HOSPITAL_COMMUNITY): Payer: Self-pay | Admitting: Psychiatry

## 2015-11-28 ENCOUNTER — Encounter: Payer: Self-pay | Admitting: Nurse Practitioner

## 2015-11-28 ENCOUNTER — Ambulatory Visit (INDEPENDENT_AMBULATORY_CARE_PROVIDER_SITE_OTHER): Payer: BC Managed Care – PPO | Admitting: Nurse Practitioner

## 2015-11-28 VITALS — BP 146/90 | Ht 62.0 in | Wt 221.0 lb

## 2015-11-28 DIAGNOSIS — H66001 Acute suppurative otitis media without spontaneous rupture of ear drum, right ear: Secondary | ICD-10-CM

## 2015-11-28 DIAGNOSIS — J329 Chronic sinusitis, unspecified: Secondary | ICD-10-CM | POA: Diagnosis not present

## 2015-11-28 DIAGNOSIS — R5383 Other fatigue: Secondary | ICD-10-CM

## 2015-11-28 DIAGNOSIS — Z79899 Other long term (current) drug therapy: Secondary | ICD-10-CM | POA: Diagnosis not present

## 2015-11-28 DIAGNOSIS — J310 Chronic rhinitis: Secondary | ICD-10-CM

## 2015-11-28 DIAGNOSIS — Z13220 Encounter for screening for lipoid disorders: Secondary | ICD-10-CM | POA: Diagnosis not present

## 2015-11-28 DIAGNOSIS — I10 Essential (primary) hypertension: Secondary | ICD-10-CM

## 2015-11-28 DIAGNOSIS — F323 Major depressive disorder, single episode, severe with psychotic features: Secondary | ICD-10-CM | POA: Diagnosis not present

## 2015-11-28 MED ORDER — AMOXICILLIN-POT CLAVULANATE 875-125 MG PO TABS: 1.0000 | ORAL_TABLET | Freq: Two times a day (BID) | ORAL | Status: AC

## 2015-11-28 MED ORDER — VERAPAMIL HCL ER 120 MG PO TBCR: EXTENDED_RELEASE_TABLET | ORAL | Status: DC

## 2015-11-28 MED ORDER — HYDROCHLOROTHIAZIDE 25 MG PO TABS: ORAL_TABLET | ORAL | Status: AC

## 2015-11-28 NOTE — Patient Instructions (Signed)
OTC antihistamine Nasacort AQ as directed 

## 2015-11-30 ENCOUNTER — Encounter: Payer: Self-pay | Admitting: Nurse Practitioner

## 2015-11-30 NOTE — Progress Notes (Signed)
Subjective:  Presents for recheck on HTN. Rarely misses pill. Has gained weight; not doing well with diet. Active. Has decided to stop seeing Dr. Tenny Crawoss for personal reasons. Has a serious history of mental health issues. Ear pain x 1 month. No sore throat. Cough. Maxillary sinus pressure. No fever. Taking Sudafed PE. Took some this morning.   Objective:   BP 146/90 mmHg  Ht 5\' 2"  (1.575 m)  Wt 221 lb (100.245 kg)  BMI 40.41 kg/m2 NAD. Alert, oriented. Lt TM clear effusion, no erythema. Rt TM: dull with moderate erythema. Pharynx mild erythema with PND noted. Neck supple with mild anterior adenopathy. Lungs clear. Heart RRR. Lower extremities no edema. Significant weight gain and central obesity.  Assessment:  Problem List Items Addressed This Visit      Cardiovascular and Mediastinum   Essential hypertension, benign - Primary   Relevant Medications   verapamil (CALAN-SR) 120 MG CR tablet   hydrochlorothiazide (HYDRODIURIL) 25 MG tablet   Other Relevant Orders   Lipid panel     Other   MDD (major depressive disorder), single episode, severe with psychotic features (HCC)    Other Visit Diagnoses    Morbid obesity due to excess calories (HCC)        Rhinosinusitis        Relevant Medications    amoxicillin-clavulanate (AUGMENTIN) 875-125 MG tablet    Acute suppurative otitis media of right ear without spontaneous rupture of tympanic membrane, recurrence not specified        Relevant Medications    amoxicillin-clavulanate (AUGMENTIN) 875-125 MG tablet    Screening for lipid disorders        Relevant Orders    Lipid panel    High risk medication use        Relevant Orders    Hepatic function panel    Basic metabolic panel    Other fatigue        Relevant Orders    TSH       Plan:  Meds ordered this encounter  Medications  . amoxicillin-clavulanate (AUGMENTIN) 875-125 MG tablet    Sig: Take 1 tablet by mouth 2 (two) times daily.    Dispense:  20 tablet    Refill:  0   Order Specific Question:  Supervising Provider    Answer:  Merlyn AlbertLUKING, WILLIAM S [[id number]]  . verapamil (CALAN-SR) 120 MG CR tablet    Sig: TAKE 1 TABLET EACH MORNING FOR BLOOD PRESSURE.    Dispense:  30 tablet    Refill:  5    Order Specific Question:  Supervising Provider    Answer:  Merlyn AlbertLUKING, WILLIAM S [[id number]]  . hydrochlorothiazide (HYDRODIURIL) 25 MG tablet    Sig: TAKE ONE TABLET EVERY MORNING FOR HIGH BLOOD PRESSURE.    Dispense:  30 tablet    Refill:  5    Order Specific Question:  Supervising Provider    Answer:  Merlyn AlbertLUKING, WILLIAM S [[id number]]   Avoid Sudafed products due to BP. OTC meds as directed for congestion and ear pain.  Encouraged healthy diet, regular activity and weight loss.  Refer to another psychiatrist. Call or go to ED in the meantime if any problems.  Return in about 6 months (around 05/28/2016) for BP recheck.

## 2015-12-03 ENCOUNTER — Encounter: Payer: Self-pay | Admitting: Family Medicine

## 2016-05-28 ENCOUNTER — Ambulatory Visit: Payer: BC Managed Care – PPO | Admitting: Nurse Practitioner

## 2016-05-29 ENCOUNTER — Ambulatory Visit (INDEPENDENT_AMBULATORY_CARE_PROVIDER_SITE_OTHER): Payer: BC Managed Care – PPO | Admitting: Nurse Practitioner

## 2016-05-29 ENCOUNTER — Encounter: Payer: Self-pay | Admitting: Nurse Practitioner

## 2016-05-29 VITALS — BP 162/98 | Ht 62.0 in | Wt 217.2 lb

## 2016-05-29 DIAGNOSIS — Z139 Encounter for screening, unspecified: Secondary | ICD-10-CM

## 2016-05-29 DIAGNOSIS — I10 Essential (primary) hypertension: Secondary | ICD-10-CM

## 2016-05-29 NOTE — Progress Notes (Signed)
Subjective:  Presents for recheck on her blood pressure. According to patient her home monitor registered her blood pressure at 102/68 2 days ago. This morning was 175/102. Has been eating tremendous amounts of salty food over the past few days including pizza and pork. Does smoke an occasional cigar, had one last night. Denies any OTC supplements or intake of any other substance that would raise her blood pressure. Has also felt sweaty and hot at times. Denies any chest pain/ischemic type pain or shortness of breath or edema. Compliant with blood pressure medication. Has done better with her weight lately.  Objective:   BP 162/98 mmHg  Ht 5\' 2"  (1.575 m)  Wt 217 lb 4 oz (98.544 kg)  BMI 39.73 kg/m2 NAD. Alert, oriented. Lungs clear. Heart regular rate rhythm. No murmur or gallop noted. Carotids no bruits or thrills. Lower extremities no edema. Thyroid normal limit to palpation, no goiter or mass noted. Nontender.  Assessment:  Problem List Items Addressed This Visit      Cardiovascular and Mediastinum   Essential hypertension, benign - Primary   Relevant Orders   Basic metabolic panel   Metanephrines, Urine, 24 hour    Other Visit Diagnoses    Screening        Relevant Orders    TSH    Hepatic function panel    Basic metabolic panel    Lipid panel      Plan: If her home blood pressure monitor is correct, patient had a sudden increase in blood pressure over the past 24 hours. Due to this as well as her sweating and feeling hot occasionally, recommend workup for possible pheochromocytoma. Patient did not get blood work done as recommended in December, this was added to her labs today. Decrease sodium in her diet, avoid any tobacco. No changes in her medicines as of today due to possible low blood pressure a couple of days ago.

## 2016-06-12 ENCOUNTER — Other Ambulatory Visit: Payer: Self-pay | Admitting: Nurse Practitioner

## 2016-06-12 NOTE — Telephone Encounter (Signed)
Before I refill this, please check to see what her BP has been running lately. This has to be under control to continue oc use. Thanks.

## 2016-06-25 IMAGING — CR DG ANKLE COMPLETE 3+V*R*
3 series · 3 of 3 positions shown · non-contrast
Comparison: None

CLINICAL DATA: Lateral ankle pain and swelling post fall 1 hr ago,
tripped on walkway and fell on knees

EXAM:
RIGHT ANKLE - COMPLETE 3+ VIEW

[view not recorded (1 of 3)]
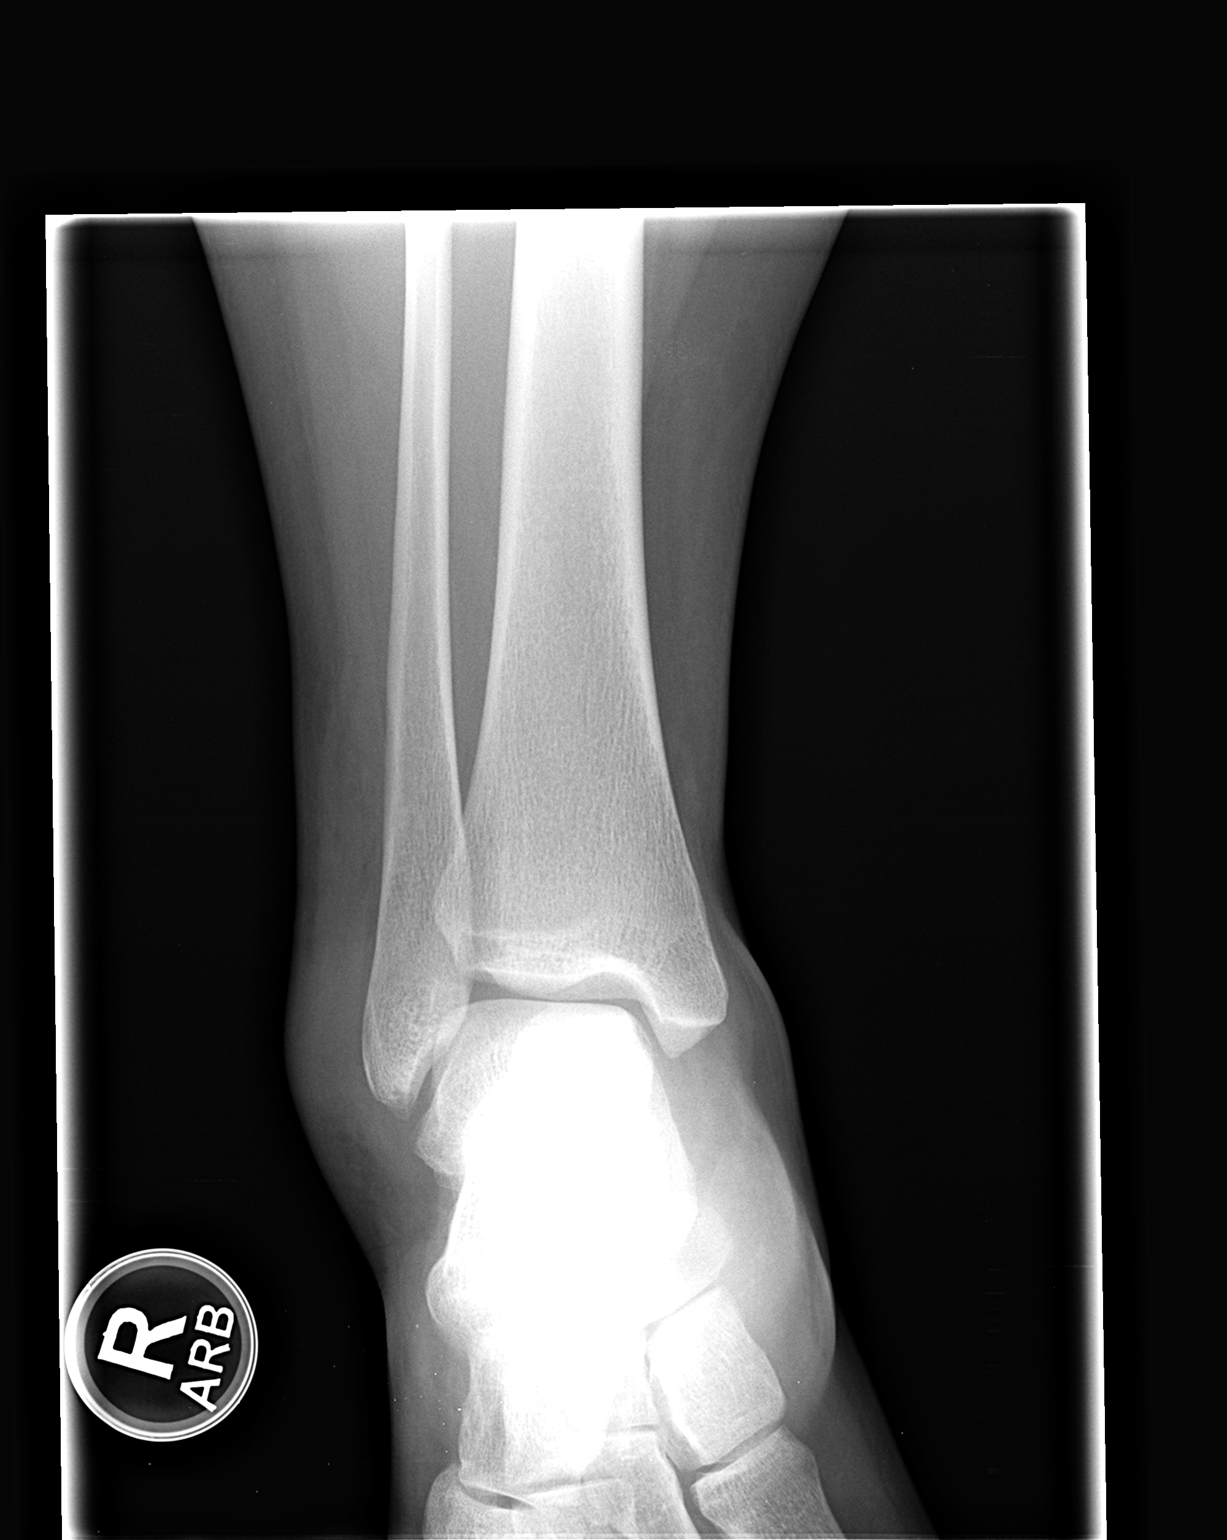

[view not recorded (2 of 3)]
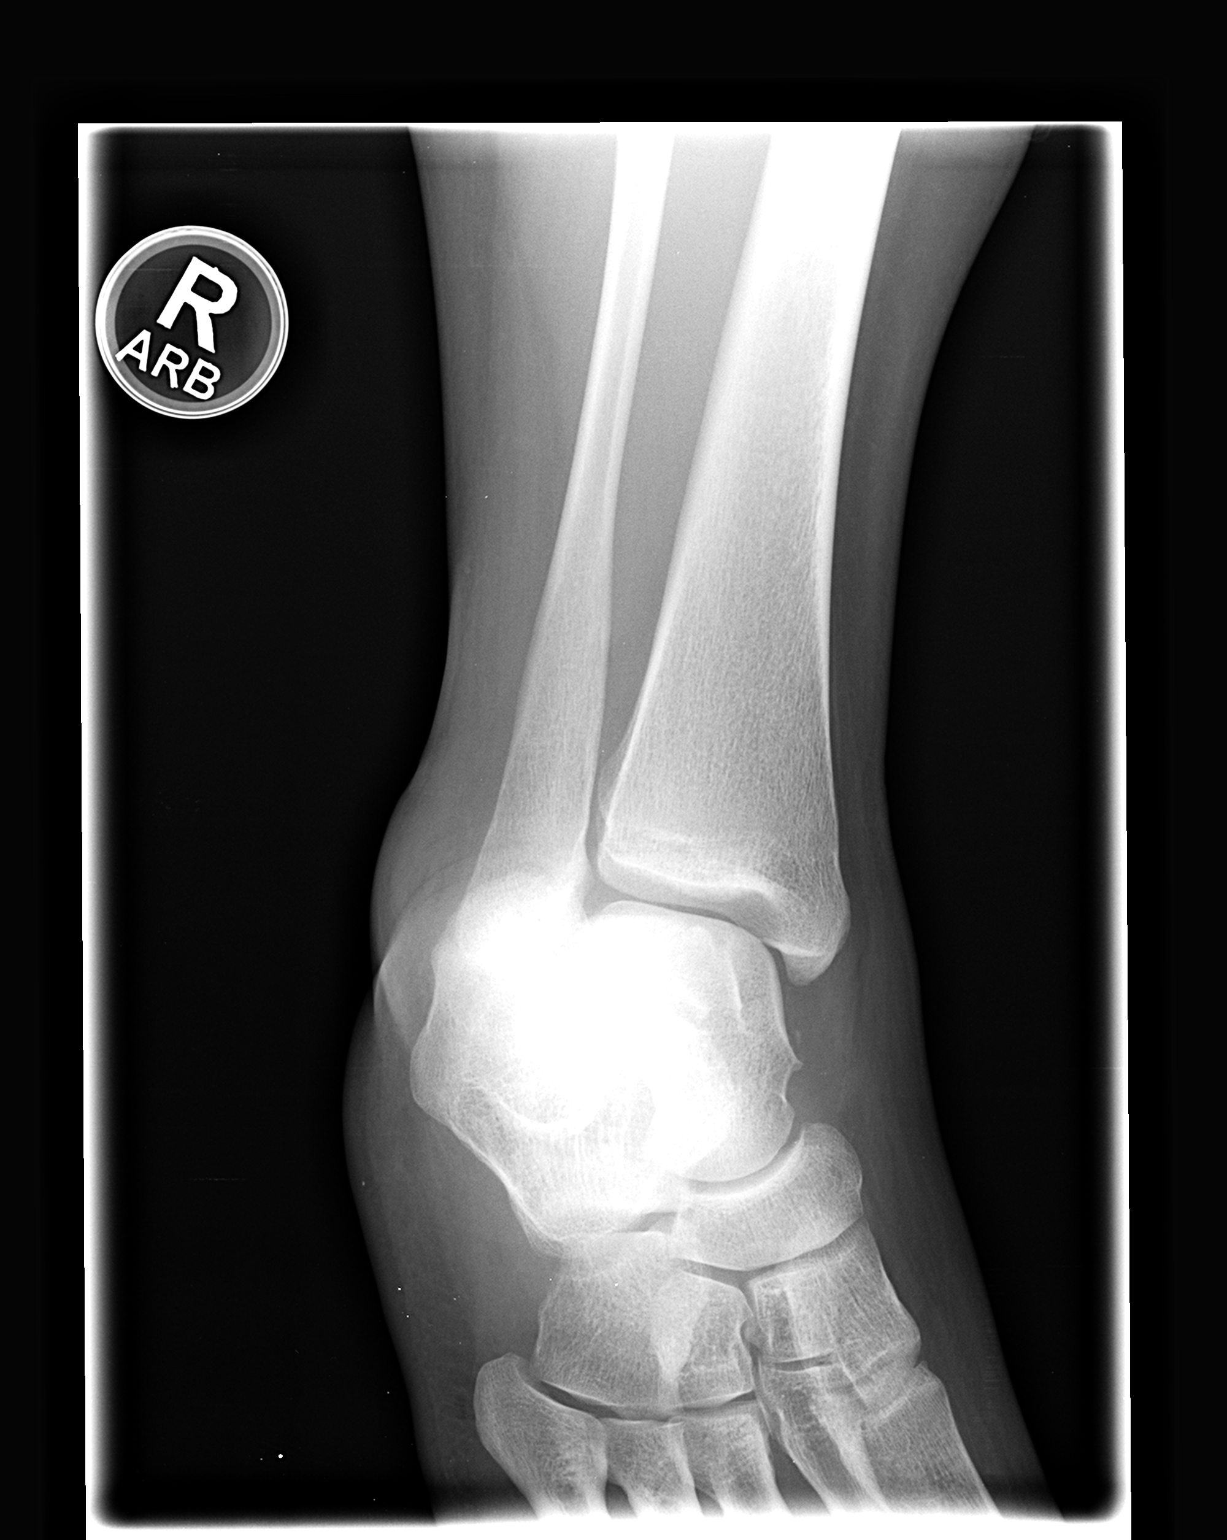

[view not recorded (3 of 3)]
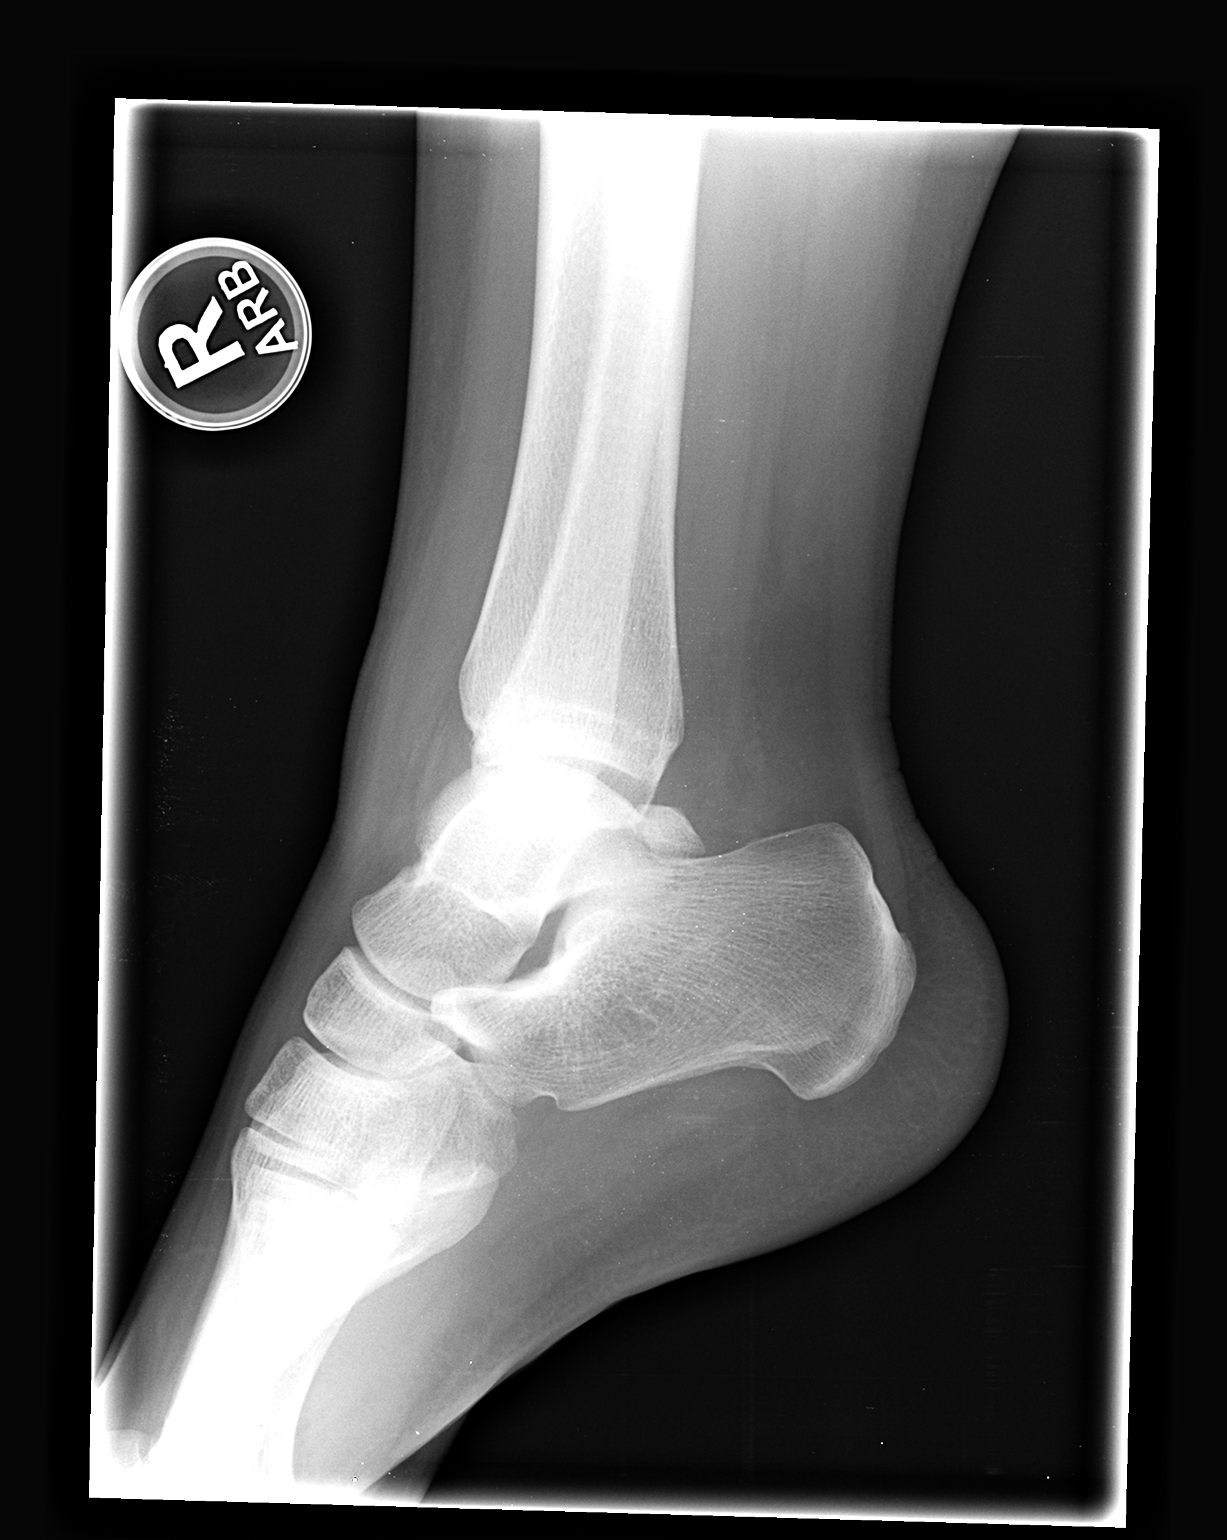

[3 of 3 positions shown; findings below may reference images not displayed]

FINDINGS: Three views of the right ankle submitted. No acute fracture or
subluxation. Ankle mortise is preserved. Significant soft tissue
swelling adjacent to lateral malleolus.
IMPRESSION: No acute fracture or subluxation.  Lateral soft tissue swelling.

## 2016-07-01 ENCOUNTER — Encounter: Payer: Self-pay | Admitting: Nurse Practitioner

## 2016-07-02 ENCOUNTER — Other Ambulatory Visit: Payer: Self-pay | Admitting: Nurse Practitioner

## 2016-07-02 MED ORDER — VERAPAMIL HCL ER 180 MG PO TBCR: 180.0000 mg | EXTENDED_RELEASE_TABLET | Freq: Every day | ORAL | Status: AC

## 2016-07-03 MED ORDER — LEVONORGESTREL-ETHINYL ESTRAD 0.15-30 MG-MCG PO TABS: ORAL_TABLET | ORAL | Status: AC

## 2016-07-03 NOTE — Addendum Note (Signed)
Addended by: Dereck LigasJOHNSON, Airiana Elman P on: 07/03/2016 02:29 PM   Modules accepted: Orders

## 2016-07-03 NOTE — Telephone Encounter (Signed)
I am not certain why you are getting a unusual message when you call. I will discuss this with our office manager. Our staff will send in 6 months refill of birth control pill. Please follow-up with Eber Jonesarolyn on a regular basis. Thanks-Dr. Lorin PicketScott Nurse's-please send in 6 months a refill. On her birth control pill. Thank you
